# Patient Record
Sex: Female | Born: 1975
Health system: Southern US, Community
[De-identification: ages and names within clinical notes are randomized; demographics above are authoritative.]

## PROBLEM LIST (undated history)

## (undated) DIAGNOSIS — B019 Varicella without complication: Secondary | ICD-10-CM

## (undated) DIAGNOSIS — M1990 Unspecified osteoarthritis, unspecified site: Secondary | ICD-10-CM

## (undated) DIAGNOSIS — T7840XA Allergy, unspecified, initial encounter: Secondary | ICD-10-CM

## (undated) DIAGNOSIS — E7800 Pure hypercholesterolemia, unspecified: Secondary | ICD-10-CM

## (undated) DIAGNOSIS — M549 Dorsalgia, unspecified: Secondary | ICD-10-CM

## (undated) HISTORY — DX: Dorsalgia, unspecified: M54.9

## (undated) HISTORY — DX: Pure hypercholesterolemia, unspecified: E78.00

## (undated) HISTORY — DX: Varicella without complication: B01.9

## (undated) HISTORY — DX: Unspecified osteoarthritis, unspecified site: M19.90

## (undated) HISTORY — DX: Allergy, unspecified, initial encounter: T78.40XA

---

## 1999-08-17 ENCOUNTER — Inpatient Hospital Stay (HOSPITAL_COMMUNITY): Admission: AD | Admit: 1999-08-17 | Discharge: 1999-08-17 | Payer: Self-pay | Admitting: Obstetrics & Gynecology

## 2001-07-26 ENCOUNTER — Encounter: Payer: Self-pay | Admitting: Internal Medicine

## 2001-07-26 ENCOUNTER — Encounter: Admission: RE | Admit: 2001-07-26 | Discharge: 2001-07-26 | Payer: Self-pay | Admitting: Internal Medicine

## 2005-05-24 ENCOUNTER — Other Ambulatory Visit: Admission: RE | Admit: 2005-05-24 | Discharge: 2005-05-24 | Payer: Self-pay | Admitting: Obstetrics and Gynecology

## 2007-04-10 ENCOUNTER — Ambulatory Visit (HOSPITAL_COMMUNITY): Admission: RE | Admit: 2007-04-10 | Discharge: 2007-04-10 | Payer: Self-pay | Admitting: Gynecology

## 2007-07-27 ENCOUNTER — Ambulatory Visit (HOSPITAL_COMMUNITY): Admission: RE | Admit: 2007-07-27 | Discharge: 2007-07-27 | Payer: Self-pay | Admitting: Gynecology

## 2007-07-30 ENCOUNTER — Ambulatory Visit (HOSPITAL_COMMUNITY): Admission: RE | Admit: 2007-07-30 | Discharge: 2007-07-30 | Payer: Self-pay | Admitting: Gynecology

## 2008-02-06 ENCOUNTER — Ambulatory Visit: Payer: Self-pay | Admitting: Gynecology

## 2008-02-15 ENCOUNTER — Ambulatory Visit: Payer: Self-pay | Admitting: Gynecology

## 2008-03-06 ENCOUNTER — Ambulatory Visit: Payer: Self-pay | Admitting: Gynecology

## 2008-03-13 ENCOUNTER — Ambulatory Visit: Payer: Self-pay | Admitting: Gynecology

## 2008-03-17 ENCOUNTER — Ambulatory Visit: Payer: Self-pay | Admitting: Gynecology

## 2011-02-08 LAB — CBC
HCT: 36
Hemoglobin: 12.6
MCHC: 34.9
RDW: 12.7

## 2011-02-08 LAB — COMPREHENSIVE METABOLIC PANEL
Alkaline Phosphatase: 40
BUN: 4 — ABNORMAL LOW
Calcium: 9
Glucose, Bld: 90
Total Protein: 7.1

## 2012-01-18 ENCOUNTER — Ambulatory Visit (INDEPENDENT_AMBULATORY_CARE_PROVIDER_SITE_OTHER): Payer: BC Managed Care – PPO | Admitting: Gynecology

## 2012-01-18 ENCOUNTER — Encounter: Payer: Self-pay | Admitting: Gynecology

## 2012-01-18 VITALS — BP 110/70

## 2012-01-18 DIAGNOSIS — N949 Unspecified condition associated with female genital organs and menstrual cycle: Secondary | ICD-10-CM

## 2012-01-18 DIAGNOSIS — N925 Other specified irregular menstruation: Secondary | ICD-10-CM

## 2012-01-18 DIAGNOSIS — N912 Amenorrhea, unspecified: Secondary | ICD-10-CM

## 2012-01-18 DIAGNOSIS — N938 Other specified abnormal uterine and vaginal bleeding: Secondary | ICD-10-CM

## 2012-01-18 LAB — CBC WITH DIFFERENTIAL/PLATELET
Basophils Absolute: 0 10*3/uL (ref 0.0–0.1)
Eosinophils Absolute: 0 10*3/uL (ref 0.0–0.7)
Lymphs Abs: 3 10*3/uL (ref 0.7–4.0)
MCH: 30.9 pg (ref 26.0–34.0)
Neutrophils Relative %: 53 % (ref 43–77)
Platelets: 309 10*3/uL (ref 150–400)
RBC: 4.08 MIL/uL (ref 3.87–5.11)
WBC: 7.6 10*3/uL (ref 4.0–10.5)

## 2012-01-18 LAB — PREGNANCY, URINE: Preg Test, Ur: NEGATIVE

## 2012-01-18 MED ORDER — MEGESTROL ACETATE 40 MG PO TABS: 40.0000 mg | ORAL_TABLET | Freq: Two times a day (BID) | ORAL | Status: DC

## 2012-01-18 NOTE — Progress Notes (Signed)
Patient is a 36 year old gravida 0 who has not been seen the office since 2009. Patient stated that her last menstrual period was reported to be sometime in 2012. She denies any vasomotor symptoms, galactorrhea, headaches, or visual disturbances. She stated that in the early part of August she had a seven-day menses. She stated that she stopped for a few days and has been having a bloody discharge on and off since then until the present time. She denies any cramping. She has not been using any form of contraception. She has had history in the past of oligomenorrhea/primary infertility for which she stated that she had been on ovulation induction the past. We do not have the record available to evaluate the we will seek to obtain to review.  Urine pregnancy test negative  Exam: Abdomen: Soft nontender rebound or guarding Bartholin urethra Skene glands: Within normal limits Vagina: Blood present in the vaginal vault Cervix: No active bleeding noted no lesion seen Uterus: Slightly anteverted normal size shape and consistency Adnexa: No palpable masses or tenderness Rectal: Not examined  Patient was counseled for an endometrial biopsy due to the fact that she had been amenorrheic for one year and then began bleeding. The cervix was cleansed with Betadine solution and a Pipelle was introduced into the intrauterine cavity with a measurement of 7 cm. Minimal to moderate amount of tissue was obtained and was submitted for histological evaluation.  The following labs were drawn today: FSH, TSH, prolactin, and CBC  Assessment/plan: Patient with long-standing history of oligomenorrhea. Will await the results of the endometrial biopsy as well as the blood tests outlined above. She'll be given a prescription Megace 40 mg to take 1 by mouth twice a day for 10 days. She will return back next week for sonohysterogram to complete evaluation discuss the above tests.

## 2012-01-18 NOTE — Patient Instructions (Addendum)

## 2012-01-19 LAB — FOLLICLE STIMULATING HORMONE: FSH: 6.6 m[IU]/mL

## 2012-01-19 LAB — TSH: TSH: 0.823 u[IU]/mL (ref 0.350–4.500)

## 2012-01-24 ENCOUNTER — Ambulatory Visit (INDEPENDENT_AMBULATORY_CARE_PROVIDER_SITE_OTHER): Payer: BC Managed Care – PPO | Admitting: Gynecology

## 2012-01-24 ENCOUNTER — Ambulatory Visit (INDEPENDENT_AMBULATORY_CARE_PROVIDER_SITE_OTHER): Payer: BC Managed Care – PPO

## 2012-01-24 ENCOUNTER — Other Ambulatory Visit: Payer: Self-pay | Admitting: Gynecology

## 2012-01-24 DIAGNOSIS — N915 Oligomenorrhea, unspecified: Secondary | ICD-10-CM | POA: Insufficient documentation

## 2012-01-24 DIAGNOSIS — N8300 Follicular cyst of ovary, unspecified side: Secondary | ICD-10-CM

## 2012-01-24 DIAGNOSIS — N760 Acute vaginitis: Secondary | ICD-10-CM

## 2012-01-24 DIAGNOSIS — A499 Bacterial infection, unspecified: Secondary | ICD-10-CM

## 2012-01-24 DIAGNOSIS — N949 Unspecified condition associated with female genital organs and menstrual cycle: Secondary | ICD-10-CM

## 2012-01-24 DIAGNOSIS — N938 Other specified abnormal uterine and vaginal bleeding: Secondary | ICD-10-CM

## 2012-01-24 DIAGNOSIS — N898 Other specified noninflammatory disorders of vagina: Secondary | ICD-10-CM

## 2012-01-24 LAB — WET PREP FOR TRICH, YEAST, CLUE

## 2012-01-24 MED ORDER — METRONIDAZOLE 0.75 % VA GEL: 1.0000 | Freq: Two times a day (BID) | VAGINAL | Status: DC

## 2012-01-24 MED ORDER — METRONIDAZOLE 0.75 % VA GEL: VAGINAL | Status: DC

## 2012-01-24 NOTE — Progress Notes (Signed)
Patient is a 36 year old gravida 0 who was seen in the office on September 3 for the first time since 2009. She stated she has not had a menstrual periods in 2012 and denied any vasomotor symptoms, galactorrhea, headaches, or visual disturbances. She did state nearly part of August she had a seven-day menses. She stated she stopped for a few days and began having a bloody discharge on and off until the office visit of September 3. She does have a history in the past of oligomenorrhea/primary infertility and had been ovulation induction medication the past. On the office visit of September 3 her urine precipitous was negative. An endometrial biopsy was done with the following result:  FINAL DIAGNOSIS Diagnosis Endometrium, biopsy - DEGENERATING SECRETORY-TYPE ENDOMETRIUM. - THERE IS NO EVIDENCE OF HYPERPLASIA OR MALIGNANCY. - SEE COMMENT. Microscopic Comment This pattern can be associated with menses, irregular shedding or breakthrough bleeding associated with hormonal therapy. Clinical correlation is suggested. (JBK:eps 01/19/12)  Her recent labs demonstrated a normal CBC, FSH, or laughing, and TSH.  She presented to the office today for sonohysterogram for better assessment of her intrauterine cavity but has voiced concern that she had a fishy like odor discharge. The wet prep did demonstrate evidence of bacterial vaginosis so the sonohysterogram will be rescheduled for next week. She will be placed on MetroGel to apply each bedtime for 5 nights. She will finish off her Megace that was given to her 40 mg twice a day to stop her bleeding as well. The ultrasound today demonstrated the following:  Uterus measured 8.1 x 4.9 x 3.9 cm with endometrial stripe of 6.1 mm. Right left or otherwise normal. Endometrial stripe of 6.1 mm. No apparent masses noted on the right or left adnexa.

## 2012-01-24 NOTE — Patient Instructions (Addendum)
Bacterial Vaginosis Bacterial vaginosis (BV) is a vaginal infection where the normal balance of bacteria in the vagina is disrupted. The normal balance is then replaced by an overgrowth of certain bacteria. There are several different kinds of bacteria that can cause BV. BV is the most common vaginal infection in women of childbearing age. CAUSES   The cause of BV is not fully understood. BV develops when there is an increase or imbalance of harmful bacteria.   Some activities or behaviors can upset the normal balance of bacteria in the vagina and put women at increased risk including:   Having a new sex partner or multiple sex partners.   Douching.   Using an intrauterine device (IUD) for contraception.   It is not clear what role sexual activity plays in the development of BV. However, women that have never had sexual intercourse are rarely infected with BV.  Women do not get BV from toilet seats, bedding, swimming pools or from touching objects around them.  SYMPTOMS   Grey vaginal discharge.   A fish-like odor with discharge, especially after sexual intercourse.   Itching or burning of the vagina and vulva.   Burning or pain with urination.   Some women have no signs or symptoms at all.  DIAGNOSIS  Your caregiver must examine the vagina for signs of BV. Your caregiver will perform lab tests and look at the sample of vaginal fluid through a microscope. They will look for bacteria and abnormal cells (clue cells), a pH test higher than 4.5, and a positive amine test all associated with BV.  RISKS AND COMPLICATIONS   Pelvic inflammatory disease (PID).   Infections following gynecology surgery.   Developing HIV.   Developing herpes virus.  TREATMENT  Sometimes BV will clear up without treatment. However, all women with symptoms of BV should be treated to avoid complications, especially if gynecology surgery is planned. Female partners generally do not need to be treated. However,  BV may spread between female sex partners so treatment is helpful in preventing a recurrence of BV.   BV may be treated with antibiotics. The antibiotics come in either pill or vaginal cream forms. Either can be used with nonpregnant or pregnant women, but the recommended dosages differ. These antibiotics are not harmful to the baby.   BV can recur after treatment. If this happens, a second round of antibiotics will often be prescribed.   Treatment is important for pregnant women. If not treated, BV can cause a premature delivery, especially for a pregnant woman who had a premature birth in the past. All pregnant women who have symptoms of BV should be checked and treated.   For chronic reoccurrence of BV, treatment with a type of prescribed gel vaginally twice a week is helpful.  HOME CARE INSTRUCTIONS   Finish all medication as directed by your caregiver.   Do not have sex until treatment is completed.   Tell your sexual partner that you have a vaginal infection. They should see their caregiver and be treated if they have problems, such as a mild rash or itching.   Practice safe sex. Use condoms. Only have 1 sex partner.  PREVENTION  Basic prevention steps can help reduce the risk of upsetting the natural balance of bacteria in the vagina and developing BV:  Do not have sexual intercourse (be abstinent).   Do not douche.   Use all of the medicine prescribed for treatment of BV, even if the signs and symptoms go away.     Tell your sex partner if you have BV. That way, they can be treated, if needed, to prevent reoccurrence.  SEEK MEDICAL CARE IF:   Your symptoms are not improving after 3 days of treatment.   You have increased discharge, pain, or fever.  MAKE SURE YOU:   Understand these instructions.   Will watch your condition.   Will get help right away if you are not doing well or get worse.  FOR MORE INFORMATION  Division of STD Prevention (DSTDP), Centers for Disease  Control and Prevention: www.cdc.gov/std [employer] Association (ASHA): www.ashastd.org  Document Released: 05/03/2005 Document Revised: 04/22/2011 Document Reviewed: 10/24/2008 [employer] Patient Information 2012 [employer]. 

## 2012-02-02 ENCOUNTER — Encounter: Payer: Self-pay | Admitting: Gynecology

## 2012-02-02 ENCOUNTER — Ambulatory Visit (INDEPENDENT_AMBULATORY_CARE_PROVIDER_SITE_OTHER): Payer: BC Managed Care – PPO | Admitting: Gynecology

## 2012-02-02 ENCOUNTER — Ambulatory Visit (INDEPENDENT_AMBULATORY_CARE_PROVIDER_SITE_OTHER): Payer: BC Managed Care – PPO

## 2012-02-02 DIAGNOSIS — N921 Excessive and frequent menstruation with irregular cycle: Secondary | ICD-10-CM

## 2012-02-02 DIAGNOSIS — N949 Unspecified condition associated with female genital organs and menstrual cycle: Secondary | ICD-10-CM

## 2012-02-02 DIAGNOSIS — N760 Acute vaginitis: Secondary | ICD-10-CM

## 2012-02-02 DIAGNOSIS — N915 Oligomenorrhea, unspecified: Secondary | ICD-10-CM

## 2012-02-02 DIAGNOSIS — N938 Other specified abnormal uterine and vaginal bleeding: Secondary | ICD-10-CM

## 2012-02-02 DIAGNOSIS — N970 Female infertility associated with anovulation: Secondary | ICD-10-CM

## 2012-02-02 MED ORDER — NYSTATIN-TRIAMCINOLONE 100000-0.1 UNIT/GM-% EX CREA: TOPICAL_CREAM | Freq: Three times a day (TID) | CUTANEOUS | Status: DC

## 2012-02-02 MED ORDER — MEDROXYPROGESTERONE ACETATE 10 MG PO TABS: ORAL_TABLET | ORAL | Status: DC

## 2012-02-02 NOTE — Patient Instructions (Addendum)
Take provera one tablet every 30 days if no period and after you ndo a home pregnancy test and it is negative. If you have a normal period every 30 days then you do not need medication. Call Dr. April Manson Reproductive Endocrinologist for appointment here in [place]. I will send him your medical information. Keep taking one prenatal vitamin daily.

## 2012-02-02 NOTE — Progress Notes (Signed)
Patient is a 36 year old gravida 0 who was seen in the office on September 3 for the first time since 2009. She stated she has not had a menstrual periods in 2012 and denied any vasomotor symptoms, galactorrhea, headaches, or visual disturbances. She did state that and the early part of August she had a seven-day menses. She stated she stopped for a few days and began having a bloody discharge on and off until the office visit of September 3. She does have a history in the past of oligomenorrhea/primary infertility and had received ovulation induction medication the past. She had seen Dr. Morrison Old in the past at [facility]. She went through 1 cycle superovulation there and did not conceive. I had given her several trials of clomiphene citrate along with intrauterine semination but was not able to conceive and she eventually went through 4 cycles of Pergonal all along with intrauterine semination and did not conceive. She did conceive with her second Pergonal  injection where she was receiving 2 amps  daily  but became hyperstimulated and spontaneously aborted.   On the office visit of September 3 her urine pregnancy test was negative. An endometrial biopsy was done with the following result:  FINAL DIAGNOSIS  Diagnosis  Endometrium, biopsy  - DEGENERATING SECRETORY-TYPE ENDOMETRIUM.  - THERE IS NO EVIDENCE OF HYPERPLASIA OR MALIGNANCY.  - SEE COMMENT.  Microscopic Comment  This pattern can be associated with menses, irregular shedding or breakthrough bleeding associated with  hormonal therapy. Clinical correlation is suggested. (JBK:eps 01/19/12)  Her recent labs demonstrated a normal CBC, FSH, or laughing, and TSH.   She presented to the office on September 9 for a sonohysterogram for better assessment of her intrauterine cavity but had voiced concern that she had a fishy like odor discharge. The wet prep did demonstrate evidence of bacterial vaginosis so the  sonohysterogram was rescheduled for today. She was placed on MetroGel to apply each bedtime for 5 nights. She finished off her Megace that was given to her 40 mg twice a day to stop her bleeding as well. The ultrasound  demonstrated the following:   Uterus measured 8.1 x 4.9 x 3.9 cm with endometrial stripe of 6.1 mm. Right left or otherwise normal. Endometrial stripe of 6.1 mm. No apparent masses noted on the right or left adnexa.  Sonohysterogram today demonstrated no intracavitary defect today.    Assessment/plan: Patient with chronic anovulation contributing to her infertility. Had responded to superovulation with Pergonal but on 2 amps had conceived but aborted. Her husband has viscous semen but normal count. She has had normal tubal patency on HSG. Patient's recent irregular bleeding as a result of her long-standing anovulation. She was given a prescription of Provera 10 mg to take for 10 days each month if she does not have a spontaneous menses but she should do a home pregnancy test first. She was having some mild external vulvovaginitis and she was given prescription mytrex cream to apply twice a day when necessary. I'm going to refer her to the reproductive endocrinologist Dr. Fermin Schwab to assist with her chronic anovulation/infertility for possible consideration of in vitro fertilization. We'll for him a copy of this office note and she will call his office for an appointment.

## 2012-07-05 ENCOUNTER — Encounter: Payer: Self-pay | Admitting: Family Medicine

## 2012-07-06 ENCOUNTER — Encounter: Payer: Self-pay | Admitting: Gynecology

## 2012-07-06 ENCOUNTER — Ambulatory Visit (INDEPENDENT_AMBULATORY_CARE_PROVIDER_SITE_OTHER): Payer: BC Managed Care – PPO | Admitting: Gynecology

## 2012-07-06 DIAGNOSIS — N979 Female infertility, unspecified: Secondary | ICD-10-CM

## 2012-07-06 DIAGNOSIS — M549 Dorsalgia, unspecified: Secondary | ICD-10-CM

## 2012-07-06 DIAGNOSIS — L293 Anogenital pruritus, unspecified: Secondary | ICD-10-CM

## 2012-07-06 DIAGNOSIS — N898 Other specified noninflammatory disorders of vagina: Secondary | ICD-10-CM

## 2012-07-06 DIAGNOSIS — N760 Acute vaginitis: Secondary | ICD-10-CM

## 2012-07-06 DIAGNOSIS — N915 Oligomenorrhea, unspecified: Secondary | ICD-10-CM

## 2012-07-06 LAB — WET PREP FOR TRICH, YEAST, CLUE: WBC, Wet Prep HPF POC: NONE SEEN

## 2012-07-06 MED ORDER — CLINDAMYCIN PHOSPHATE 2 % VA CREA: 1.0000 | TOPICAL_CREAM | Freq: Every day | VAGINAL | Status: DC

## 2012-07-06 MED ORDER — MEDROXYPROGESTERONE ACETATE 10 MG PO TABS: ORAL_TABLET | ORAL | Status: DC

## 2012-07-06 NOTE — Patient Instructions (Addendum)
Endometriosis Endometriosis is a disease that occurs when the endometrium (lining of the uterus) is misplaced outside of its normal location. It may occur in many locations close to the uterus (womb), but commonly on the ovaries, fallopian tubes, vagina (birth canal) and bowel located close to the uterus. Because the uterus sloughs (expels) its lining every month (menses), there is bleeding whereever the endometrial tissue is located. SYMPTOMS  Often there are no symptoms. However, because blood is irritating to tissues not normally exposed to it, when symptoms occur they vary with the location of the misplaced endometrium. Symptoms often include back and abdominal pain. Periods may be heavier and intercourse may be painful. Infertility may be present. You may have all of these symptoms at one time or another or you may have months with no symptoms at all. Although the symptoms occur mainly during menses, they can occur mid-cycle as well, and usually terminate with menopause. DIAGNOSIS  Your caregiver may recommend a blood test and urine test (urinalysis) to help rule out other conditions. Another common test is ultrasound, a painless procedure that uses sound waves to make a sonogram "picture" of abnormal tissue that could be endometriosis. If your bowel movements are painful around your periods, your caregiver may advise a barium enema (an X-ray of the lower bowel), to try to find the source of your pain. This is sometimes confirmed by laparoscopy. Laparoscopy is a procedure where your caregiver looks into your abdomen with a laparoscope (a small pencil sized telescope). Your caregiver may take a tiny piece of tissue (biopsy) from any abnormal tissue to confirm or document your problem. These tissues are sent to the lab and a pathologist looks at them under the microscope to give a microscopic diagnosis. TREATMENT  Once the diagnosis is made, it can be treated by destruction of the misplaced endometrial  tissue using heat (diathermy), laser, cutting (excision), or chemical means. It may also be treated with hormonal therapy. When using hormonal therapy menses are eliminated, therefore eliminating the monthly exposure to blood by the misplaced endometrial tissue. Only in severe cases is it necessary to perform a hysterectomy with removal of the tubes, uterus and ovaries. HOME CARE INSTRUCTIONS   Only take over-the-counter or prescription medicines for pain, discomfort, or fever as directed by your caregiver.  Avoid activities that produce pain, including a physical sexual relationship.  Do not take aspirin as this may increase bleeding when not on hormonal therapy.  See your caregiver for pain or problems not controlled with treatment. SEEK IMMEDIATE MEDICAL CARE IF:   Your pain is severe and is not responding to pain medicine.  You develop severe nausea and vomiting, or you cannot keep foods down.  Your pain localizes to the right lower part of your abdomen (possible appendicitis).  You have swelling or increasing pain in the abdomen.  You have a fever.  You see blood in your stool. MAKE SURE YOU:   Understand these instructions.  Will watch your condition.  Will get help right away if you are not doing well or get worse. Document Released: 04/30/2000 Document Revised: 07/26/2011 Document Reviewed: 12/20/2007 [facility] Patient Information 2013 [place], [place].  Diagnostic Laparoscopy Laparoscopy is a surgical procedure. It is used to diagnose and treat diseases inside the belly(abdomen). It is usually a brief, common, and relatively simple procedure. The laparoscopeis a thin, lighted, pencil-sized instrument. It is like a telescope. It is inserted into your abdomen through a small cut (incision). Your caregiver can look at the  organs inside your body through this instrument. He or she can see if there is anything abnormal. Laparoscopy can be done either in a hospital or outpatient  clinic. You may be given a mild sedative to help you relax before the procedure. Once in the operating room, you will be given a drug to make you sleep (general anesthesia). Laparoscopy usually lasts less than 1 hour. After the procedure, you will be monitored in a recovery area until you are stable and doing well. Once you are home, it will take 2 to 3 days to fully recover. RISKS AND COMPLICATIONS  Laparoscopy has relatively few risks. Your caregiver will discuss the risks with you before the procedure. Some problems that can occur include:  Infection.  Bleeding.  Damage to other organs.  Anesthetic side effects. PROCEDURE Once you receive anesthesia, your surgeon inflates the abdomen with a harmless gas (carbon dioxide). This makes the organs easier to see. The laparoscope is inserted into the abdomen through a small incision. This allows your surgeon to see into the abdomen. Other small instruments are also inserted into the abdomen through other small openings. Many surgeons attach a video camera to the laparoscope to enlarge the view. During a diagnostic laparoscopy, the surgeon may be looking for inflammation, infection, or cancer. Your surgeon may take tissue samples(biopsies). The samples are sent to a specialist in looking at cells and tissue samples (pathologist). The pathologist examines them under a microscope. Biopsies can help to diagnose or confirm a disease. AFTER THE PROCEDURE   The gas is released from inside the abdomen.  The incisions are closed with stitches (sutures). Because these incisions are small (usually less than 1/2 inch), there is usually minimal discomfort after the procedure. There may be some mild discomfort in the throat. This is from the tube placed in the throat while you were sleeping. You may have some mild abdominal discomfort. There may also be discomfort from the instrument placement incisions in the abdomen.  The recovery time is shortened as long as  there are no complications.  You will rest in a recovery room until stable and doing well. As long as there are no complications, you may be allowed to go home. FINDING OUT THE RESULTS OF YOUR TEST Not all test results are available during your visit. If your test results are not back during the visit, make an appointment with your caregiver to find out the results. Do not assume everything is normal if you have not heard from your caregiver or the medical facility. It is important for you to follow up on all of your test results. HOME CARE INSTRUCTIONS   Take all medicines as directed.  Only take over-the-counter or prescription medicines for pain, discomfort, or fever as directed by your caregiver.  Resume daily activities as directed.  Showers are preferred over baths.  You may resume sexual activities in 1 week or as directed.  Do not drive while taking narcotics. SEEK MEDICAL CARE IF:   There is increasing abdominal pain.  There is new pain in the shoulders (shoulder strap areas).  You feel lightheaded or faint.  You have the chills.  You or your child has an oral temperature above 102 F (38.9 C).  There is pus-like (purulent) drainage from any of the wounds.  You are unable to pass gas or have a bowel movement.  You feel sick to your stomach (nauseous) or throw up (vomit). MAKE SURE YOU:   Understand these instructions.  Will watch  your condition.  Will get help right away if you are not doing well or get worse. Document Released: 08/09/2000 Document Revised: 07/26/2011 Document Reviewed: 05/03/2007 [facility] Patient Information 2013 [place], [place].  Infertility WHAT IS INFERTILITY?  Infertility is usually defined as not being able to get pregnant after trying for one year of regular sexual intercourse without the use of contraceptives. Or not being able to carry a pregnancy to term and have a baby. The infertility rate in the [place] is around  10%. Pregnancy is the result of a chain of events. A woman must release an egg from one of her ovaries (ovulation). The egg must be fertilized by the female sperm. Then it travels through a fallopian tube into the uterus (womb), where it attaches to the wall of the uterus and grows. A man must have enough sperm, and the sperm must join with (fertilize) the egg along the way, at the proper time. The fertilized egg must then become attached to the inside of the uterus. While this may seem simple, many things can happen to prevent pregnancy from occurring.  WHOSE PROBLEM IS IT?  About 20% of infertility cases are due to problems with the man (female factors) and 65% are due to problems with the woman (female factors). Other cases are due to a combination of female and female factors or to unknown causes.  WHAT CAUSES INFERTILITY IN MEN?  Infertility in men is often caused by problems with making enough normal sperm or getting the sperm to reach the egg. Problems with sperm may exist from birth or develop later in life, due to illness or injury. Some men produce no sperm, or produce too few sperm (oligospermia). Other problems include:  Sexual dysfunction.  Hormonal or endocrine problems.  Age. Female fertility decreases with age, but not at as young an age as female fertility.  Infection.  Congenital problems. Birth defect, such as absence of the tubes that carry the sperm (vas deferens).  Genetic/chromosomal problems.  Antisperm antibody problems.  Retrograde ejaculation (sperm go into the bladder).  Varicoceles, spematoceles, or tumors of the testicles.  Lifestyle can influence the number and quality of a man's sperm.  Alcohol and drugs can temporarily reduce sperm quality.  Environmental toxins, including pesticides and lead, may cause some cases of infertility in men. WHAT CAUSES INFERTILITY IN WOMEN?   Problems with ovulation account for most infertility in women. Without ovulation, eggs  are not available to be fertilized.  Signs of problems with ovulation include irregular menstrual periods or no periods at all.  Simple lifestyle factors, including stress, diet, or athletic training, can affect a woman's hormonal balance.  Age. Fertility begins to decrease in women in the early 31s and is worse after age 65.  Much less often, a hormonal imbalance from a serious medical problem, such as a pituitary gland tumor, thyroid or other chronic medical disease, can cause ovulation problems.  Pelvic infections.  Polycystic ovary syndrome (increase in female hormones, unable to ovulate).  Alcohol or illegal drugs.  Environmental toxins, radiation, pesticides, and certain chemicals.  Aging is an important factor in female infertility.  The ability of a woman's ovaries to produce eggs declines with age, especially after age 36. About one third of couples where the woman is over 35 will have problems with fertility.  By the time she reaches menopause when her monthly periods stop for good, a woman can no longer produce eggs or become pregnant.  Other problems can also lead  to infertility in women. If the fallopian tubes are blocked at one or both ends, the egg cannot travel through the tubes into the uterus. Scar tissue (adhesions) in the pelvis may cause blocked tubes. This may result from pelvic inflammatory disease, endometriosis, or surgery for an ectopic pregnancy (fertilized egg implanted outside the uterus) or any pelvic or abdominal surgery causing adhesions.  Fibroid tumors or polyps of the uterus.  Congenital (birth defect) abnormalities of the uterus.  Infection of the cervix (cervicitis).  Cervical stenosis (narrowing).  Abnormal cervical mucus.  Polycystic ovary syndrome.  Having sexual intercourse too often (every other day or 4 to 5 times a week).  Obesity.  Anorexia.  Poor nutrition.  Over exercising, with loss of body fat.  DES. Your mother received  diethylstilbesterol hormone when pregnant with you. HOW IS INFERTILITY TESTED?  If you have been trying to have a baby without success, you may want to seek medical help. You should not wait for one year of trying before seeing a health care provider if:  You are over 35.  You have reason to believe that there may be a fertility problem. A medical evaluation may determine the reasons for a couple's infertility. Usually this process begins with:  Physical exams.  Medical histories of both partners.  Sexual histories of both partners. If there is no obvious problem, like improperly timed intercourse or absence of ovulation, tests may be needed.   For a man, testing usually begins with tests of his semen to look at:  The number of sperm.  The shape of sperm.  Movement of his sperm.  Taking a complete medical and surgical history.  Physical examination.  Check for infection of the female reproductive organs. Sometimes hormone tests are done.   For a woman, the first step in testing is to find out if she is ovulating each month. There are several ways to do this. For example, she can keep track of changes in her morning body temperature and in the texture of her cervical mucus. Another tool is a home ovulation test kit, which can be bought at drug or grocery stores.  Checks of ovulation can also be done in the doctor's office, using blood tests for hormone levels or ultrasound tests of the ovaries. If the woman is ovulating, more tests will need to be done. Some common female tests include:  Hysterosalpingogram: An x-ray of the fallopian tubes and uterus after they are injected with dye. It shows if the tubes are open and shows the shape of the uterus.  Laparoscopy: An exam of the tubes and other female organs for disease. A lighted tube called a laparoscope is used to see inside the abdomen.  Endometrial biopsy: Sample of uterus tissue taken on the first day of the menstrual period,  to see if the tissue indicates you are ovulating.  Transvaginal ultrasound: Examines the female organs.  Hysteroscopy: Uses a lighted tube to examine the cervix and inside the uterus, to see if there are any abnormalities inside the uterus. TREATMENT  Depending on the test results, different treatments can be suggested. The type of treatment depends on the cause. 85 to 90% of infertility cases are treated with drugs or surgery.   Various fertility drugs may be used for women with ovulation problems. It is important to talk with your caregiver about the drug to be used. You should understand the drug's benefits and side effects. Depending on the type of fertility drug and the dosage of  the drug used, multiple births (twins or multiples) can occur in some women.  If needed, surgery can be done to repair damage to a woman's ovaries, fallopian tubes, cervix, or uterus.  Surgery or medical treatment for endometriosis or polycystic ovary syndrome. Sometimes a man has an infertility problem that can be corrected with medicine or by surgery.  Intrauterine insemination (IUI) of sperm, timed with ovulation.  Change in lifestyle, if that is the cause (lose weight, increase exercise, and stop smoking, drinking excessively, or taking illegal drugs).  Other types of surgery:  Removing growths inside and on the uterus.  Removing scar tissue from inside of the uterus.  Fixing blocked tubes.  Removing scar tissue in the pelvis and around the female organs. WHAT IS ASSISTED REPRODUCTIVE TECHNOLOGY (ART)?  Assisted reproductive technology (ART) is another form of special methods used to help infertile couples. ART involves handling both the woman's eggs and the man's sperm. Success rates vary and depend on many factors. ART can be expensive and time-consuming. But ART has made it possible for many couples to have children that otherwise would not have been conceived. Some methods are listed below:  In  vitro fertilization (IVF). IVF is often used when a woman's fallopian tubes are blocked or when a man has low sperm counts. A drug is used to stimulate the ovaries to produce multiple eggs. Once mature, the eggs are removed and placed in a culture dish with the man's sperm for fertilization. After about 40 hours, the eggs are examined to see if they have become fertilized by the sperm and are dividing into cells. These fertilized eggs (embryos) are then placed in the woman's uterus. This bypasses the fallopian tubes.  Gamete intrafallopian transfer (GIFT) is similar to IVF, but used when the woman has at least one normal fallopian tube. Three to five eggs are placed in the fallopian tube, along with the man's sperm, for fertilization inside the woman's body.  Zygote intrafallopian transfer (ZIFT), also called tubal embryo transfer, combines IVF and GIFT. The eggs retrieved from the woman's ovaries are fertilized in the lab and placed in the fallopian tubes rather than in the uterus.  ART procedures sometimes involve the use of donor eggs (eggs from another woman) or previously frozen embryos. Donor eggs may be used if a woman has impaired ovaries or has a genetic disease that could be passed on to her baby.  When performing ART, you are at higher risk for resulting in multiple pregnancies, twins, triplets or more.  Intracytoplasma sperm injection is a procedure that injects a single sperm into the egg to fertilize it.  Embryo transplant is a procedure that starts after growing an embryo in a special media (chemical solution) developed to keep the embryo alive for 2 to 5 days, and then transplanting it into the uterus. In cases where a cause cannot be found and pregnancy does not occur, adoption may be a consideration. Document Released: 05/06/2003 Document Revised: 07/26/2011 Document Reviewed: 04/01/2009 [facility] Patient Information 2013 [place], [place].   Every 30 days if you do not have a  spontaneous period do a home pregnancy test. If it is negative take the provera 10mg  one daily for 5- 10 days to start your period. If you have a period every 30 days by itself then you do not need to take the provera that month. Remember to take a prenatal vitamin daily.  You can use the ovulation predictor kit that you can purchase in the pharmacy. First  day of bleeding is first day of cycle . On day 12-16 check your urine with the ovulation predictor twcie a day. If it changes this means that you will be ovulating that night and that you should have sex with husband that night and next night.

## 2012-07-06 NOTE — Progress Notes (Signed)
Patient is a 37 year old with history of oligomenorrhea and primary infertility.she presented to the office today complaining of vulvar pruritus. There appears to be a slight [place] barrier. She has been evaluated several months ago and was found to have a normal HSG, FSH, TSH, and prolactin.husband had a normal semen analysis only slightly viscous. She had seen Dr. Morrison Old in the past at [facility] infertility clinic. She went through 1 cycle superovulation there and did not conceive. I had given her several trials of clomiphene citrate along with intrauterine semination but was not able to conceive and she eventually went through 4 cycles of Pergonal all along with intrauterine semination and did not conceive. She did conceive with her second Pergonal injection where she was receiving 2 amps daily but became hyperstimulated and spontaneously aborted.  Because of her oligomenorrhea on 01/18/2012 she had a benign endometrial biopsy.  Patient had previously been given instructions to take Provera 10 mg for 10 days of the month if she did not have a spontaneous menses every 30 days providing that she had done a home pregnancy test at home. She states that she has not been taking the medication and had not had a menstrual period since September but did have one for 5 days of every 10th of this year. She denied any visual disturbances or any unusual headaches. She had been suffering from back pain and saw an acupuncturist who is treating her for a suspected herniated disc. She denies any dyspareunia but was concerned whether she may have underlying endometriosis.  We went through a lengthy discussion on endometriosis pocket diagnosis and had that treated. I have provided her with literature information on endometriosis, as well as a laparoscopy. She is overdue for her annual exam and we'll schedule one for next month and we will discuss a day and. We had referred her to a different reproductive  endocrinologist for consideration of in vitro fertilization but she states it is too expensive.  Her pelvic exam: Bartholin urethra Skene was within normal limits Vagina: Clear fascia odor-like discharge Cervix: No lesions seen Uterus: Anteverted normal size shape and consistency Adnexa: No palpable masses or tenderness Rectal exam not done  Wet prep positive amine, moderate clue cells too numerous to count bacteria.  Assessment/plan: Bacterial vaginosis will be treated with Cleocin vaginal cream to apply each bedtime for 5 days. Written instructions on taking Provera 10 mg every 30 days if she does not have a spontaneous menses providing that she does a home urine pregnancy test first. I also gave her information on utilization and timing of the ovulation predictor kit to help her conceive. She was reminded to take a prenatal vitamin daily. She will return back next month for annual exam and we will discuss if she wants to proceed with a laparoscopy.

## 2012-07-31 ENCOUNTER — Encounter: Payer: BC Managed Care – PPO | Admitting: Gynecology

## 2012-07-31 ENCOUNTER — Other Ambulatory Visit: Payer: BC Managed Care – PPO

## 2012-09-12 ENCOUNTER — Telehealth: Payer: Self-pay | Admitting: General Practice

## 2012-09-12 ENCOUNTER — Ambulatory Visit (INDEPENDENT_AMBULATORY_CARE_PROVIDER_SITE_OTHER): Payer: BC Managed Care – PPO | Admitting: Family Medicine

## 2012-09-12 ENCOUNTER — Encounter: Payer: Self-pay | Admitting: Family Medicine

## 2012-09-12 VITALS — BP 90/70 | HR 74 | Temp 97.8°F | Ht 59.75 in | Wt 110.6 lb

## 2012-09-12 DIAGNOSIS — Z0000 Encounter for general adult medical examination without abnormal findings: Secondary | ICD-10-CM

## 2012-09-12 DIAGNOSIS — Z800 Family history of malignant neoplasm of digestive organs: Secondary | ICD-10-CM | POA: Insufficient documentation

## 2012-09-12 DIAGNOSIS — Z1331 Encounter for screening for depression: Secondary | ICD-10-CM

## 2012-09-12 DIAGNOSIS — N970 Female infertility associated with anovulation: Secondary | ICD-10-CM

## 2012-09-12 LAB — CBC WITH DIFFERENTIAL/PLATELET
Basophils Relative: 0.5 % (ref 0.0–3.0)
Eosinophils Relative: 1.3 % (ref 0.0–5.0)
Hemoglobin: 14 g/dL (ref 12.0–15.0)
Lymphocytes Relative: 35.5 % (ref 12.0–46.0)
MCHC: 35.2 g/dL (ref 30.0–36.0)
Monocytes Relative: 8.8 % (ref 3.0–12.0)
Neutro Abs: 4.2 10*3/uL (ref 1.4–7.7)
RBC: 4.4 Mil/uL (ref 3.87–5.11)
WBC: 7.7 10*3/uL (ref 4.5–10.5)

## 2012-09-12 LAB — LIPID PANEL
Cholesterol: 253 mg/dL — ABNORMAL HIGH (ref 0–200)
Total CHOL/HDL Ratio: 5

## 2012-09-12 LAB — HEPATIC FUNCTION PANEL
ALT: 43 U/L — ABNORMAL HIGH (ref 0–35)
AST: 29 U/L (ref 0–37)
Total Protein: 7.6 g/dL (ref 6.0–8.3)

## 2012-09-12 LAB — BASIC METABOLIC PANEL
BUN: 9 mg/dL (ref 6–23)
CO2: 29 mEq/L (ref 19–32)
Chloride: 105 mEq/L (ref 96–112)
Creatinine, Ser: 0.6 mg/dL (ref 0.4–1.2)
Glucose, Bld: 101 mg/dL — ABNORMAL HIGH (ref 70–99)
Potassium: 3.8 mEq/L (ref 3.5–5.1)

## 2012-09-12 LAB — LDL CHOLESTEROL, DIRECT: Direct LDL: 189.4 mg/dL

## 2012-09-12 LAB — TSH: TSH: 1.73 u[IU]/mL (ref 0.35–5.50)

## 2012-09-12 MED ORDER — IBUPROFEN 600 MG PO TABS: 600.0000 mg | ORAL_TABLET | Freq: Three times a day (TID) | ORAL | Status: DC | PRN

## 2012-09-12 NOTE — Telephone Encounter (Signed)
Pt called to the office returning Mary's call regarding referral to OBGYN. Please make sure to contact pt at [phone]. Thanks!

## 2012-09-12 NOTE — Assessment & Plan Note (Signed)
New to provider, ongoing for pt.  Pt prefers to to see female GYN.  Referral made.

## 2012-09-12 NOTE — Assessment & Plan Note (Signed)
Pt's PE WNL.  Check labs.  UTD on GYN.  Anticipatory guidance provided.

## 2012-09-12 NOTE — Progress Notes (Signed)
  Subjective:    Patient ID: Sophia Moody, female    DOB: May 12, 1976, 37 y.o.   MRN: [id number]  HPI New to establish.  GYN- Lily Peer  Vaginal odor- pt reports strong odor after intercourse.  Odor will last for 1-2 days.  Denies vaginal d/c, itching, or burning.  Has hx of BV.  No current odor.  Pt is not comfortable w/ female GYN.  Family hx of liver cancer- dad died of liver cancer 4 yrs ago, pt unaware if he had Hepatitis.  Pt immigrated from [place].  Review of Systems Patient reports no vision/ hearing changes, adenopathy,fever, weight change,  persistant/recurrent hoarseness , swallowing issues, chest pain, palpitations, edema, persistant/recurrent cough, hemoptysis, dyspnea (rest/exertional/paroxysmal nocturnal), gastrointestinal bleeding (melena, rectal bleeding), abdominal pain, significant heartburn, bowel changes, GU symptoms (dysuria, hematuria, incontinence),  syncope, focal weakness, memory loss, numbness & tingling, skin/hair/nail changes, abnormal bruising or bleeding, anxiety, or depression.     Objective:   Physical Exam General Appearance:    Alert, cooperative, no distress, appears stated age  Head:    Normocephalic, without obvious abnormality, atraumatic  Eyes:    PERRL, conjunctiva/corneas clear, EOM's intact, fundi    benign, both eyes  Ears:    Normal TM's and external ear canals, both ears  Nose:   Nares normal, septum midline, mucosa normal, no drainage    or sinus tenderness  Throat:   Lips, mucosa, and tongue normal; teeth and gums normal  Neck:   Supple, symmetrical, trachea midline, no adenopathy;    Thyroid: no enlargement/tenderness/nodules  Back:     Symmetric, no curvature, ROM normal, no CVA tenderness  Lungs:     Clear to auscultation bilaterally, respirations unlabored  Chest Wall:    No tenderness or deformity   Heart:    Regular rate and rhythm, S1 and S2 normal, no murmur, rub   or gallop  Breast Exam:    Deferred to GYN  Abdomen:     Soft,  non-tender, bowel sounds active all four quadrants,    no masses, no organomegaly  Genitalia:    Deferred to GYN  Rectal:    Extremities:   Extremities normal, atraumatic, no cyanosis or edema  Pulses:   2+ and symmetric all extremities  Skin:   Skin color, texture, turgor normal, no rashes or lesions  Lymph nodes:   Cervical, supraclavicular, and axillary nodes normal  Neurologic:   CNII-XII intact, normal strength, sensation and reflexes    throughout          Assessment & Plan:

## 2012-09-12 NOTE — Assessment & Plan Note (Signed)
New.  Pt is uncertain whether father had hepatitis contributing to liver cancer. Pt is at higher risk due to fact she is from [place].  Will check labs to determine if pt is infected.

## 2012-09-12 NOTE — Patient Instructions (Signed)
We'll notify you of your lab results Someone will call you with your GYN appt Take the Ibuprofen as needed for the back pain- take w/ food Call with any questions or concerns Welcome!  We're glad to have you!

## 2012-09-13 LAB — HEPATITIS C ANTIBODY: HCV Ab: NEGATIVE

## 2012-09-15 LAB — VITAMIN D 1,25 DIHYDROXY
Vitamin D2 1, 25 (OH)2: <8 pg/mL
Vitamin D3 1, 25 (OH)2: 36 pg/mL

## 2012-09-18 ENCOUNTER — Encounter: Payer: Self-pay | Admitting: General Practice

## 2013-04-19 ENCOUNTER — Ambulatory Visit: Payer: BC Managed Care – PPO | Admitting: Family Medicine

## 2013-04-19 DIAGNOSIS — Z0289 Encounter for other administrative examinations: Secondary | ICD-10-CM

## 2013-07-22 ENCOUNTER — Other Ambulatory Visit: Payer: Self-pay | Admitting: Gynecology

## 2014-03-18 ENCOUNTER — Encounter: Payer: Self-pay | Admitting: Family Medicine

## 2014-05-17 LAB — HM MAMMOGRAPHY

## 2014-05-17 LAB — HM PAP SMEAR

## 2015-07-23 ENCOUNTER — Encounter: Payer: Self-pay | Admitting: Behavioral Health

## 2015-07-23 ENCOUNTER — Telehealth: Payer: Self-pay | Admitting: Behavioral Health

## 2015-07-23 NOTE — Telephone Encounter (Signed)
Pre-Visit Call completed with patient and chart updated.   Pre-Visit Info documented in Specialty Comments under SnapShot.    

## 2015-07-24 ENCOUNTER — Encounter: Payer: Self-pay | Admitting: Family Medicine

## 2015-07-24 ENCOUNTER — Ambulatory Visit (INDEPENDENT_AMBULATORY_CARE_PROVIDER_SITE_OTHER): Payer: BLUE CROSS/BLUE SHIELD | Admitting: Family Medicine

## 2015-07-24 VITALS — BP 90/70 | HR 87 | Temp 97.6°F | Ht 62.0 in | Wt 104.8 lb

## 2015-07-24 DIAGNOSIS — Z13220 Encounter for screening for lipoid disorders: Secondary | ICD-10-CM

## 2015-07-24 DIAGNOSIS — N970 Female infertility associated with anovulation: Secondary | ICD-10-CM

## 2015-07-24 DIAGNOSIS — Z131 Encounter for screening for diabetes mellitus: Secondary | ICD-10-CM

## 2015-07-24 DIAGNOSIS — N912 Amenorrhea, unspecified: Secondary | ICD-10-CM

## 2015-07-24 DIAGNOSIS — Z130 Encounter for screening for diseases of the blood and blood-forming organs and certain disorders involving the immune mechanism: Secondary | ICD-10-CM

## 2015-07-24 DIAGNOSIS — Z7189 Other specified counseling: Secondary | ICD-10-CM

## 2015-07-24 DIAGNOSIS — M545 Low back pain, unspecified: Secondary | ICD-10-CM

## 2015-07-24 DIAGNOSIS — Z7689 Persons encountering health services in other specified circumstances: Secondary | ICD-10-CM

## 2015-07-24 LAB — CBC
HEMATOCRIT: 42.6 % (ref 36.0–46.0)
Hemoglobin: 14.2 g/dL (ref 12.0–15.0)
MCHC: 33.4 g/dL (ref 30.0–36.0)
MCV: 91.5 fl (ref 78.0–100.0)
Platelets: 255 10*3/uL (ref 150.0–400.0)
RBC: 4.66 Mil/uL (ref 3.87–5.11)
RDW: 13.1 % (ref 11.5–15.5)
WBC: 8.3 10*3/uL (ref 4.0–10.5)

## 2015-07-24 LAB — LIPID PANEL
CHOL/HDL RATIO: 4
CHOLESTEROL: 225 mg/dL — AB (ref 0–200)
HDL: 55.2 mg/dL (ref >39.00)
LDL CALC: 150 mg/dL — AB (ref 0–99)
NonHDL: 169.52
Triglycerides: 100 mg/dL (ref 0.0–149.0)
VLDL: 20 mg/dL (ref 0.0–40.0)

## 2015-07-24 LAB — COMPREHENSIVE METABOLIC PANEL
ALBUMIN: 4.5 g/dL (ref 3.5–5.2)
ALK PHOS: 52 U/L (ref 39–117)
ALT: 16 U/L (ref 0–35)
AST: 19 U/L (ref 0–37)
BILIRUBIN TOTAL: 0.3 mg/dL (ref 0.2–1.2)
BUN: 13 mg/dL (ref 6–23)
CO2: 27 mEq/L (ref 19–32)
CREATININE: 0.68 mg/dL (ref 0.40–1.20)
Calcium: 9.4 mg/dL (ref 8.4–10.5)
Chloride: 104 mEq/L (ref 96–112)
GFR: 101.45 mL/min (ref >60.00)
GLUCOSE: 99 mg/dL (ref 70–99)
Potassium: 4.6 mEq/L (ref 3.5–5.1)
SODIUM: 138 meq/L (ref 135–145)
TOTAL PROTEIN: 7.6 g/dL (ref 6.0–8.3)

## 2015-07-24 LAB — HEMOGLOBIN A1C: HEMOGLOBIN A1C: 5.8 % (ref 4.6–6.5)

## 2015-07-24 MED ORDER — MEDROXYPROGESTERONE ACETATE 10 MG PO TABS: ORAL_TABLET | ORAL | Status: DC

## 2015-07-24 MED ORDER — MELOXICAM 7.5 MG PO TABS: 7.5000 mg | ORAL_TABLET | Freq: Every day | ORAL | Status: DC

## 2015-07-24 NOTE — Progress Notes (Signed)
[facility] [address], [address] [place], [place] [address] [phone] [phone]  Date:  07/24/2015   Name:  Sophia Moody   DOB:  November 29, 1975   MRN:  [id number]  PCP:  Abbe AmsterdamOPLAND,Jabar Krysiak, MD    Chief Complaint: Establish Care   History of Present Illness:  Sophia Moody is a 40 y.o. very pleasant female patient who presents with the following:  Here today to establish care. She has been seen by this office in 2014- a little less than 3 years ago.    BP Readings from Last 3 Encounters:  09/12/12 90/70  01/18/12 110/70   Her GYN has been Dr. Lily PeerFernandez- per his last note in 2014 they were using 10 days of provera each month to induce menstruation.  She has primary infertility and oligomenorrhea She takes the provera maybe every 2-3 months.  She states that she does not see OBG any longer.  She still does not get menses unless she takes a course of provera.  She is not sure of why she does not get menses and is unclear on the cause of her infertility She has never had a child- it is unclear to me if she and her husband still want to try for this or if they have decided to be child free.   She states that she has "problems with my back pain a lot."  She went to PT and got some exercises that do help her.  As long as she does her exercises she does ok.  She has had back pain for 5 years or so.  Reports that she has had plain films and MRI x2 although I cannot see these records She does use mobic as needed for pain and would like a RF of same  She is fasting today for labs.    She thought this was to be her CPE- explained that we will have to schedule this at a later date She was seen last year at [facility] for her CPE according to her.   She did have a mammogram last year; it was normal   Patient Active Problem List   Diagnosis Date Noted  . Routine general medical examination at a health care facility 09/12/2012  . Family history of liver cancer  09/12/2012  . Infertility associated with anovulation 02/02/2012  . Oligomenorrhea 01/24/2012    Past Medical History  Diagnosis Date  . High cholesterol   . Chicken pox   . Back pain   . Allergy   . Arthritis     No past surgical history on file.  Social History  Substance Use Topics  . Smoking status: Never Smoker   . Smokeless tobacco: Never Used  . Alcohol Use: No    Family History  Problem Relation Age of Onset  . Cancer Father     liver  . Hypertension Father     No Known Allergies  Medication list has been reviewed and updated.  Current Outpatient Prescriptions on File Prior to Visit  Medication Sig Dispense Refill  . medroxyPROGESTERone (PROVERA) 10 MG tablet TAKE ONE TABLET 5-10 DAYS IF NO MENSES SPONTANEOUS MENSES EVERY 30 DAYS 30 tablet 1  . Naphazoline-Pheniramine (OPCON-A) 0.027-0.315 % SOLN Apply 1-2 drops to eye as needed. Reported on 07/23/2015    . Polyethyl Glycol-Propyl Glycol (SYSTANE ULTRA) 0.4-0.3 % SOLN Apply 1-2 drops to eye as needed. Reported on 07/23/2015     No current facility-administered medications on  file prior to visit.    Review of Systems:  As per HPI- otherwise negative.   Physical Examination: Filed Vitals:   07/24/15 0829  Pulse: 87  Temp: 97.6 F (36.4 C)   Filed Vitals:   07/24/15 0829  Height:  (1.575 m)  Weight: 104 lb 12.8 oz (47.537 kg)   Body mass index is 19.16 kg/(m^2). Ideal Body Weight: Weight in (lb) to have BMI = 25: 136.4  GEN: WDWN, NAD, Non-toxic, A & O x 3, very slight build HEENT: Atraumatic, Normocephalic. Neck supple. No masses, No LAD. Ears and Nose: No external deformity. CV: RRR, No M/G/R. No JVD. No thrill. No extra heart sounds. PULM: CTA B, no wheezes, crackles, rhonchi. No retractions. No resp. distress. No accessory muscle use. ABD: S, NT, ND, +BS. No rebound. No HSM. EXTR: No c/c/e NEURO Normal gait.  PSYCH: Normally interactive. Conversant. Not depressed or anxious appearing.   Calm demeanor.  Normal lumbar flexion. No apparent thoracolumbar scoliosis   Assessment and Plan: Establishing care with new doctor, encounter for  Primary anovulatory infertility - Plan: POCT urine pregnancy, medroxyPROGESTERone (PROVERA) 10 MG tablet  Amenorrhea - Plan: POCT urine pregnancy, medroxyPROGESTERone (PROVERA) 10 MG tablet  Screening for hyperlipidemia - Plan: Lipid panel  Screening for diabetes mellitus - Plan: Comprehensive metabolic panel, Hemoglobin A1c  Screening for deficiency anemia - Plan: CBC  Midline low back pain without sciatica - Plan: meloxicam (MOBIC) 7.5 MG tablet  Labs today as above and will follow-up with her There is some language and cultural barrier so I am not clear on if the pt and her husband still desire to have a child or not.  Offered the advice that having a child is likely possible but they will need to act soon. I am glad to refer to fertility if they like.  Also reassured that choosing to not have children is ok Will check HCG today- assuming negative she will continue to use provera every 30- 60 days as needed to induce a bleed Refilled her mobic to use as needed for back pain.  I am not privy to her previous eval but it she reports that simply doing some PT exercises and stretches keeps her back pain at bay.  Encouraged her to continue same   Signed Abbe Amsterdam, MD

## 2015-07-24 NOTE — Progress Notes (Signed)
Pre visit review using our clinic review tool, if applicable. No additional management support is needed unless otherwise documented below in the visit note. 

## 2015-07-24 NOTE — Patient Instructions (Signed)
It was nice to meet you today!   I will be in touch with your labs You can continue to use the provera as needed to bring on a period- if you do not have bleeding every 30 days or so, please take a home pregnancy test.  If negative then take the provera for 5- 10 days.  You should then have a bleed  If you DO wish to have a baby it is likely possible, but you will want to discuss this with a fertility specialist soon!  Let me know if you need a referral.  It is also ok to choose to not have children.    Please come and see me in about 6 months for a physical exam

## 2015-07-25 LAB — POCT URINE PREGNANCY: Preg Test, Ur: NEGATIVE

## 2017-03-08 NOTE — Progress Notes (Signed)
[facility] [address], [address] [place], [place] [address] [phone] [phone]  Date:  03/10/2017   Name:  Sophia Moody   DOB:  1975/11/01   MRN:  [id number]  PCP:  Sophia Moody    Chief Complaint: Annual Exam (Pt here for CPE with PAP. Flu vaccine. )   History of Present Illness:  Sophia Moody is a 41 y.o. very pleasant female patient who presents with the following: Here today for a CPE Last seen by myself in March of 2017: Here today to establish care. She has been seen by this office in 2014- a little less than 3 years ago.       BP Readings from Last 3 Encounters:  09/12/12 90/70  01/18/12 110/70   Her GYN has been Dr. Lily PeerFernandez- per his last note in 2014 they were using 10 days of provera each month to induce menstruation.  She has primary infertility and oligomenorrhea She takes the provera maybe every 2-3 months.  She states that she does not see OBG any longer.  She still does not get menses unless she takes a course of provera.  She is not sure of why she does not get menses and is unclear on the cause of her infertility She has never had a child- it is unclear to me if she and her husband still want to try for this or if they have decided to be child free.   She states that she has "problems with my back pain a lot."  She went to PT and got some exercises that do help her.  As long as she does her exercises she does ok.  She has had back pain for 5 years or so.  Reports that she has had plain films and MRI x2 although I cannot see these records She does use mobic as needed for pain and would like a RF of same  She is fasting today for labs.    She thought this was to be her CPE- explained that we will have to schedule this at a later date She was seen last year at [facility] for her CPE according to her.   She did have a mammogram last year; it was normal   Here today for a CPE  Tetanus: unsure of last, would  like to do today Flu: today Mammo: she is due, I will schedule this for her Pap: would like to do today, never had an abnormal per her knowledge Labs: she is due, she is fasting today  She is using mobic for her chronic back pain as above- it does help, she needs a RF today Also needs a refill of her provera which she takes every few months to stimulate a bleed.  Otherwise she doesnot have menses  Patient Active Problem List   Diagnosis Date Noted  . Routine general medical examination at a health care facility 09/12/2012  . Family history of liver cancer 09/12/2012  . Infertility associated with anovulation 02/02/2012  . Oligomenorrhea 01/24/2012    Past Medical History:  Diagnosis Date  . Allergy   . Arthritis   . Back pain   . Chicken pox   . High cholesterol     No past surgical history on file.  Social History  Substance Use Topics  . Smoking status: Never Smoker  . Smokeless tobacco: Never Used  . Alcohol use No    Family History  Problem  Relation Age of Onset  . Cancer Father        liver  . Hypertension Father     No Known Allergies  Medication list has been reviewed and updated.  Current Outpatient Prescriptions on File Prior to Visit  Medication Sig Dispense Refill  . Magnesium 250 MG TABS Take 250 mg by mouth as needed.    . Naphazoline-Pheniramine (OPCON-A) 0.027-0.315 % SOLN Apply 1-2 drops to eye as needed. Reported on 07/23/2015    . Polyethyl Glycol-Propyl Glycol (SYSTANE ULTRA) 0.4-0.3 % SOLN Apply 1-2 drops to eye as needed. Reported on 07/23/2015     No current facility-administered medications on file prior to visit.     Review of Systems:  As per HPI- otherwise negative. No fever or chills No chest pain or SOB No cough or ST   Physical Examination: Vitals:   03/10/17 1120  BP: 100/64  Pulse: 89  Temp: 97.9 F (36.6 C)  SpO2: 99%   Vitals:   03/10/17 1120  Weight: 105 lb 12.8 oz (48 kg)  Height: 5' (1.524 m)   Body mass index  is 20.66 kg/m. Ideal Body Weight: Weight in (lb) to have BMI = 25: 127.7  GEN: WDWN, NAD, Non-toxic, A & O x 3, slim build, looks well HEENT: Atraumatic, Normocephalic. Neck supple. No masses, No LAD.  Bilateral TM wnl, oropharynx normal.  PEERL,EOMI.   Ears and Nose: No external deformity. CV: RRR, No M/G/R. No JVD. No thrill. No extra heart sounds. PULM: CTA B, no wheezes, crackles, rhonchi. No retractions. No resp. distress. No accessory muscle use. ABD: S, NT, ND,. No rebound. No HSM. EXTR: No c/c/e NEURO Normal gait.  PSYCH: Normally interactive. Conversant. Not depressed or anxious appearing.  Calm demeanor.  Breast: normal exam, no masses/ dimpling/ discharge Pelvic: normal, no vaginal lesions or discharge. Uterus normal, no CMT, no adnexal tendereness or masses     Assessment and Plan: Physical exam  Screening for hyperlipidemia - Plan: Lipid panel  Screening for diabetes mellitus - Plan: Comprehensive metabolic panel, Hemoglobin A1c  Screening for deficiency anemia - Plan: CBC  Primary anovulatory infertility - Plan: medroxyPROGESTERone (PROVERA) 10 MG tablet  Amenorrhea - Plan: medroxyPROGESTERone (PROVERA) 10 MG tablet  Immunization due - Plan: Tdap vaccine greater than or equal to 7yo IM  Screening for cervical cancer - Plan: Cytology - PAP  Screening for breast cancer - Plan: MM SCREENING BREAST TOMO BILATERAL  Chronic midline low back pain without sciatica - Plan: meloxicam (MOBIC) 7.5 MG tablet  CPE today Refilled her mobic and provera Pap and labs pending as above Encouraged continued healthy diet and weight maintained Continue to abstain from smoking Pap today Ordered mammo for her  Signed Abbe Amsterdam, Moody

## 2017-03-10 ENCOUNTER — Ambulatory Visit (INDEPENDENT_AMBULATORY_CARE_PROVIDER_SITE_OTHER): Payer: BLUE CROSS/BLUE SHIELD | Admitting: Family Medicine

## 2017-03-10 ENCOUNTER — Other Ambulatory Visit (HOSPITAL_COMMUNITY)
Admission: RE | Admit: 2017-03-10 | Discharge: 2017-03-10 | Disposition: A | Discharge status: Home / Self Care | Payer: BLUE CROSS/BLUE SHIELD | Source: Ambulatory Visit | Attending: Family Medicine | Admitting: Family Medicine

## 2017-03-10 VITALS — BP 100/64 | HR 89 | Temp 97.9°F | Ht 60.0 in | Wt 105.8 lb

## 2017-03-10 DIAGNOSIS — M545 Low back pain: Secondary | ICD-10-CM | POA: Diagnosis not present

## 2017-03-10 DIAGNOSIS — Z131 Encounter for screening for diabetes mellitus: Secondary | ICD-10-CM | POA: Diagnosis not present

## 2017-03-10 DIAGNOSIS — G8929 Other chronic pain: Secondary | ICD-10-CM | POA: Diagnosis not present

## 2017-03-10 DIAGNOSIS — Z1231 Encounter for screening mammogram for malignant neoplasm of breast: Secondary | ICD-10-CM

## 2017-03-10 DIAGNOSIS — N912 Amenorrhea, unspecified: Secondary | ICD-10-CM | POA: Diagnosis not present

## 2017-03-10 DIAGNOSIS — N970 Female infertility associated with anovulation: Secondary | ICD-10-CM

## 2017-03-10 DIAGNOSIS — Z23 Encounter for immunization: Secondary | ICD-10-CM | POA: Diagnosis not present

## 2017-03-10 DIAGNOSIS — Z0000 Encounter for general adult medical examination without abnormal findings: Secondary | ICD-10-CM

## 2017-03-10 DIAGNOSIS — Z124 Encounter for screening for malignant neoplasm of cervix: Secondary | ICD-10-CM | POA: Diagnosis present

## 2017-03-10 DIAGNOSIS — Z13220 Encounter for screening for lipoid disorders: Secondary | ICD-10-CM | POA: Diagnosis not present

## 2017-03-10 DIAGNOSIS — Z1239 Encounter for other screening for malignant neoplasm of breast: Secondary | ICD-10-CM

## 2017-03-10 DIAGNOSIS — Z130 Encounter for screening for diseases of the blood and blood-forming organs and certain disorders involving the immune mechanism: Secondary | ICD-10-CM | POA: Insufficient documentation

## 2017-03-10 LAB — COMPREHENSIVE METABOLIC PANEL
ALBUMIN: 4.6 g/dL (ref 3.5–5.2)
ALK PHOS: 54 U/L (ref 39–117)
ALT: 20 U/L (ref 0–35)
AST: 18 U/L (ref 0–37)
BILIRUBIN TOTAL: 0.4 mg/dL (ref 0.2–1.2)
BUN: 12 mg/dL (ref 6–23)
CO2: 31 mEq/L (ref 19–32)
Calcium: 9.6 mg/dL (ref 8.4–10.5)
Chloride: 101 mEq/L (ref 96–112)
Creatinine, Ser: 0.69 mg/dL (ref 0.40–1.20)
GFR: 98.97 mL/min (ref >60.00)
Glucose, Bld: 102 mg/dL — ABNORMAL HIGH (ref 70–99)
Potassium: 5.1 mEq/L (ref 3.5–5.1)
Sodium: 138 mEq/L (ref 135–145)
TOTAL PROTEIN: 7.8 g/dL (ref 6.0–8.3)

## 2017-03-10 LAB — LIPID PANEL
CHOL/HDL RATIO: 5
Cholesterol: 243 mg/dL — ABNORMAL HIGH (ref 0–200)
HDL: 50.9 mg/dL (ref >39.00)
LDL Cholesterol: 155 mg/dL — ABNORMAL HIGH (ref 0–99)
NonHDL: 191.76
Triglycerides: 184 mg/dL — ABNORMAL HIGH (ref 0.0–149.0)
VLDL: 36.8 mg/dL (ref 0.0–40.0)

## 2017-03-10 LAB — CBC
HCT: 44.4 % (ref 36.0–46.0)
HEMOGLOBIN: 14.6 g/dL (ref 12.0–15.0)
MCHC: 32.8 g/dL (ref 30.0–36.0)
MCV: 94 fl (ref 78.0–100.0)
Platelets: 289 10*3/uL (ref 150.0–400.0)
RBC: 4.72 Mil/uL (ref 3.87–5.11)
RDW: 12.7 % (ref 11.5–15.5)
WBC: 7.4 10*3/uL (ref 4.0–10.5)

## 2017-03-10 LAB — HEMOGLOBIN A1C: HEMOGLOBIN A1C: 5.8 % (ref 4.6–6.5)

## 2017-03-10 MED ORDER — MEDROXYPROGESTERONE ACETATE 10 MG PO TABS: ORAL_TABLET | ORAL | 3 refills | Status: AC

## 2017-03-10 MED ORDER — MELOXICAM 7.5 MG PO TABS: 7.5000 mg | ORAL_TABLET | Freq: Every day | ORAL | 5 refills | Status: AC

## 2017-03-10 NOTE — Patient Instructions (Addendum)
It was nice to see you today - take care and I will be in touch with your labs We will set you up for a mammogram  You got your tdap and flu shots today I refilled your provera and your mobic for back pain

## 2017-03-15 LAB — CYTOLOGY - PAP
Diagnosis: NEGATIVE
HPV (WINDOPATH): NOT DETECTED

## 2017-03-16 ENCOUNTER — Encounter (HOSPITAL_BASED_OUTPATIENT_CLINIC_OR_DEPARTMENT_OTHER): Payer: Self-pay

## 2017-03-16 ENCOUNTER — Ambulatory Visit (HOSPITAL_BASED_OUTPATIENT_CLINIC_OR_DEPARTMENT_OTHER)
Admission: RE | Admit: 2017-03-16 | Discharge: 2017-03-16 | Disposition: A | Discharge status: Home / Self Care | Payer: BLUE CROSS/BLUE SHIELD | Source: Ambulatory Visit | Attending: Family Medicine | Admitting: Family Medicine

## 2017-03-16 DIAGNOSIS — Z1231 Encounter for screening mammogram for malignant neoplasm of breast: Secondary | ICD-10-CM | POA: Diagnosis present

## 2017-03-16 DIAGNOSIS — Z1239 Encounter for other screening for malignant neoplasm of breast: Secondary | ICD-10-CM

## 2017-03-18 ENCOUNTER — Encounter: Payer: Self-pay | Admitting: Family Medicine

## 2017-07-14 ENCOUNTER — Encounter: Payer: Self-pay | Admitting: Medical

## 2017-07-14 ENCOUNTER — Ambulatory Visit: Payer: BLUE CROSS/BLUE SHIELD | Admitting: Medical

## 2017-07-14 VITALS — BP 94/66 | HR 97 | Temp 98.3°F | Resp 16 | Ht 60.0 in | Wt 107.0 lb

## 2017-07-14 DIAGNOSIS — J40 Bronchitis, not specified as acute or chronic: Secondary | ICD-10-CM | POA: Diagnosis not present

## 2017-07-14 DIAGNOSIS — R05 Cough: Secondary | ICD-10-CM

## 2017-07-14 DIAGNOSIS — J0100 Acute maxillary sinusitis, unspecified: Secondary | ICD-10-CM | POA: Diagnosis not present

## 2017-07-14 DIAGNOSIS — R059 Cough, unspecified: Secondary | ICD-10-CM

## 2017-07-14 MED ORDER — FLUTICASONE PROPIONATE 50 MCG/ACT NA SUSP: 2.0000 | Freq: Every day | NASAL | 1 refills | Status: DC

## 2017-07-14 MED ORDER — CEFTRIAXONE SODIUM 500 MG IJ SOLR
500.0000 mg | Freq: Once | INTRAMUSCULAR | Status: AC
  Administered 2017-07-14: 500 mg via INTRAMUSCULAR

## 2017-07-14 MED ORDER — AZITHROMYCIN 250 MG PO TABS: ORAL_TABLET | ORAL | 0 refills | Status: AC

## 2017-07-14 MED ORDER — HYDROCODONE-HOMATROPINE 5-1.5 MG/5ML PO SYRP: 5.0000 mL | ORAL_SOLUTION | Freq: Three times a day (TID) | ORAL | 0 refills | Status: AC | PRN

## 2017-07-14 NOTE — Progress Notes (Signed)
Subjective:    Patient ID: Sophia Moody, female    DOB: [birth year], 42 y.o.   MRN: [id number]  HPI  Pt in states about over past 10 days has been sick. She states nasal congestion and head feels heavy. She had been coughing up some mucus in mornings(during the day cough dry). Describes yellowish mucus when coughs. Some colored mucus when blows nose.  No wheezing.  Some sinus pressure.  Early on had some fever and sweats but not recently.  Pt had some body aches early on moderate to severe.  That was 10 days ago but very minimal achiness now.    Review of Systems  Constitutional: Positive for diaphoresis and fever. Negative for chills.  HENT: Positive for congestion, sinus pressure and sinus pain. Negative for ear pain and trouble swallowing.   Respiratory: Positive for cough. Negative for chest tightness, shortness of breath and wheezing.   Cardiovascular: Negative for chest pain and palpitations.  Gastrointestinal: Negative for abdominal pain, blood in stool and vomiting.  Musculoskeletal: Negative for back pain and neck pain.  Skin: Negative for rash.  Neurological: Negative for dizziness and headaches.  Hematological: Negative for adenopathy. Does not bruise/bleed easily.    Past Medical History:  Diagnosis Date  . Allergy   . Arthritis   . Back pain   . Chicken pox   . High cholesterol      Social History   Socioeconomic History  . Marital status: Married    Spouse name: Not on file  . Number of children: Not on file  . Years of education: Not on file  . Highest education level: Not on file  Social Needs  . Financial resource strain: Not on file  . Food insecurity - worry: Not on file  . Food insecurity - inability: Not on file  . Transportation needs - medical: Not on file  . Transportation needs - non-medical: Not on file  Occupational History  . Not on file  Tobacco Use  . Smoking status: Never Smoker  . Smokeless tobacco: Never Used  Substance and  Sexual Activity  . Alcohol use: No  . Drug use: No  . Sexual activity: Yes    Birth control/protection: None  Other Topics Concern  . Not on file  Social History Narrative  . Not on file    No past surgical history on file.  Family History  Problem Relation Age of Onset  . Cancer Father        liver  . Hypertension Father     No Known Allergies  Current Outpatient Medications on File Prior to Visit  Medication Sig Dispense Refill  . Magnesium 250 MG TABS Take 250 mg by mouth as needed.    . medroxyPROGESTERone (PROVERA) 10 MG tablet TAKE ONE TABLET 5-10 DAYS IF NO MENSES SPONTANEOUS MENSES EVERY 30 DAYS 30 tablet 3  . meloxicam (MOBIC) 7.5 MG tablet Take 1 tablet (7.5 mg total) by mouth daily. Use if needed for back pain 30 tablet 5  . Naphazoline-Pheniramine (OPCON-A) 0.027-0.315 % SOLN Apply 1-2 drops to eye as needed. Reported on 07/23/2015    . Polyethyl Glycol-Propyl Glycol (SYSTANE ULTRA) 0.4-0.3 % SOLN Apply 1-2 drops to eye as needed. Reported on 07/23/2015     No current facility-administered medications on file prior to visit.     BP 94/66 (BP Location: Left Arm, Patient Position: Sitting, Cuff Size: Small)   Pulse 97   Temp 98.3 F (36.8 C) (Oral)   Resp  16   Ht 5' (1.524 m)   Wt 107 lb (48.5 kg)   SpO2 99%   BMI 20.90 kg/m       Objective:   Physical Exam  General  Mental Status - Alert. General Appearance - Well groomed. Not in acute distress.  Skin Rashes- No Rashes.  HEENT Head- Normal. Ear Auditory Canal - Left- Normal. Right - Normal.Tympanic Membrane- Left- Normal. Right- Normal. Eye Sclera/Conjunctiva- Left- Normal. Right- Normal. Nose & Sinuses Nasal Mucosa- Left-  Boggy and Congested. Right-  Boggy and  Congested.Faint maxillary pressure and frontal sinus pressure. Mouth & Throat Lips: Upper Lip- Normal: no dryness, cracking, pallor, cyanosis, or vesicular eruption. Lower Lip-Normal: no dryness, cracking, pallor, cyanosis or vesicular  eruption. Buccal Mucosa- Bilateral- No Aphthous ulcers. Oropharynx- No Discharge or Erythema. Tonsils: Characteristics- Bilateral- No Erythema or Congestion. Size/Enlargement- Bilateral- No enlargement. Discharge- bilateral-None.  Neck Neck- Supple. No Masses.   Chest and Lung Exam Auscultation: Breath Sounds:-Clear even and unlabored.  Cardiovascular Auscultation:Rythm- Regular, rate and rhythm. Murmurs & Other Heart Sounds:Ausculatation of the heart reveal- No Murmurs.  Lymphatic Head & Neck General Head & Neck Lymphatics: Bilateral: Description- No Localized lymphadenopathy.       Assessment & Plan:   You appear to have bronchitis and sinusitis. Rest hydrate and tylenol for fever. I am prescribing cough medicine Hycodan, and azithromycin antibiotic. For your nasal congestion I prescribed Flonase.  At patient request for injection antibiotic and after discussion, I decided we will give patient Rocephin 500 mg IM.  She also asked for steroid injection but I thought that was unnecessary at this point.  You should gradually get better. If not then notify us and would recommend a chest xray.  Follow up in 7-10 days or as needed

## 2017-07-14 NOTE — Patient Instructions (Signed)
You appear to have bronchitis and sinusitis. Rest hydrate and tylenol for fever. I am prescribing cough medicine Hycodan, and azithromycin antibiotic. For your nasal congestion I prescribed Flonase.  At patient request for injection antibiotic and after discussion, I decided we will give patient Rocephin 500 mg IM.    You should gradually get better. If not then notify us and would recommend a chest xray.  Follow up in 7-10 days or as needed

## 2018-02-08 ENCOUNTER — Other Ambulatory Visit: Payer: Self-pay | Admitting: Family Medicine

## 2018-02-08 DIAGNOSIS — Z1231 Encounter for screening mammogram for malignant neoplasm of breast: Secondary | ICD-10-CM

## 2018-03-21 ENCOUNTER — Ambulatory Visit (HOSPITAL_BASED_OUTPATIENT_CLINIC_OR_DEPARTMENT_OTHER)
Admission: RE | Admit: 2018-03-21 | Discharge: 2018-03-21 | Disposition: A | Discharge status: Home / Self Care | Payer: BLUE CROSS/BLUE SHIELD | Source: Ambulatory Visit | Attending: Family Medicine | Admitting: Family Medicine

## 2018-03-21 DIAGNOSIS — Z1231 Encounter for screening mammogram for malignant neoplasm of breast: Secondary | ICD-10-CM | POA: Insufficient documentation

## 2019-02-16 ENCOUNTER — Other Ambulatory Visit (HOSPITAL_BASED_OUTPATIENT_CLINIC_OR_DEPARTMENT_OTHER): Payer: Self-pay | Admitting: Family Medicine

## 2019-02-16 DIAGNOSIS — Z1231 Encounter for screening mammogram for malignant neoplasm of breast: Secondary | ICD-10-CM

## 2019-03-27 ENCOUNTER — Ambulatory Visit (HOSPITAL_BASED_OUTPATIENT_CLINIC_OR_DEPARTMENT_OTHER): Payer: BLUE CROSS/BLUE SHIELD

## 2019-05-12 IMAGING — MG DIGITAL SCREENING BILATERAL MAMMOGRAM WITH TOMO AND CAD
8 series · 8 of 24 positions shown · non-contrast
Comparison: Previous exam(s).

CLINICAL DATA: Screening.

EXAM:
DIGITAL SCREENING BILATERAL MAMMOGRAM WITH TOMO AND CAD

[L MLO synth-2D]
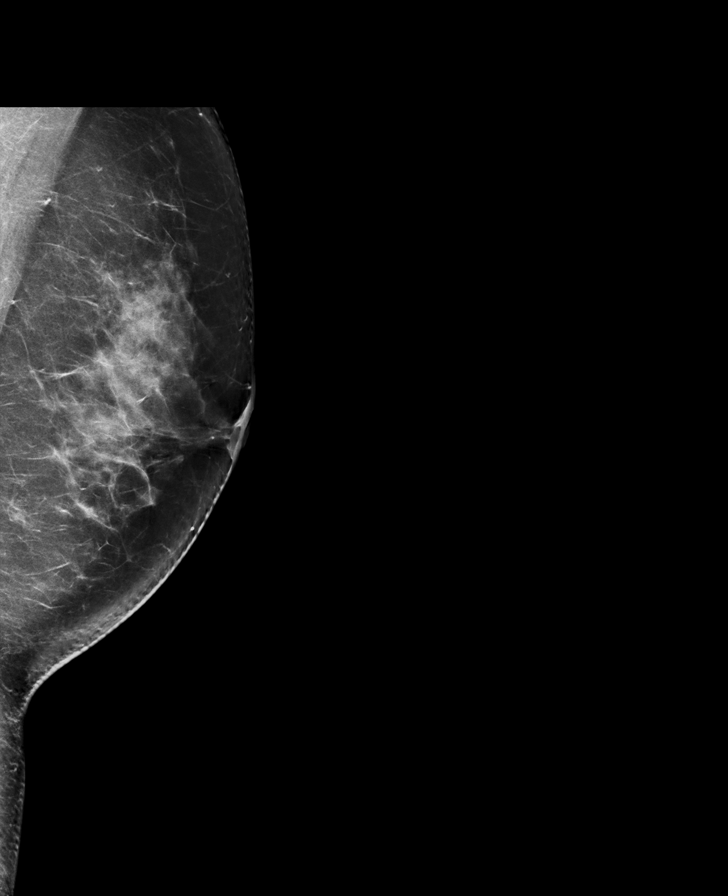

[R MLO synth-2D]
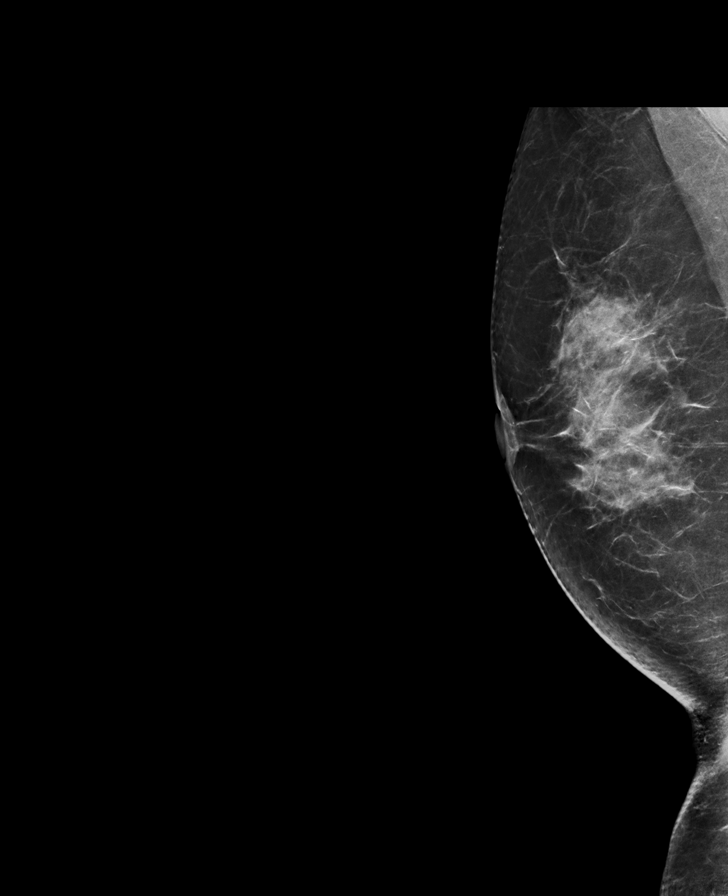

[L CC synth-2D]
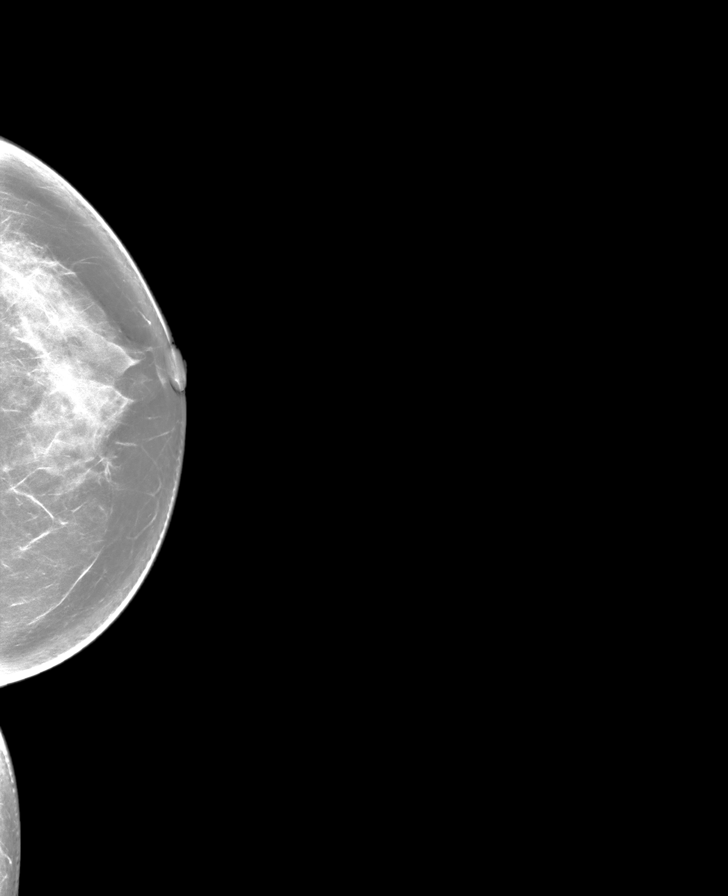

[R CC synth-2D]
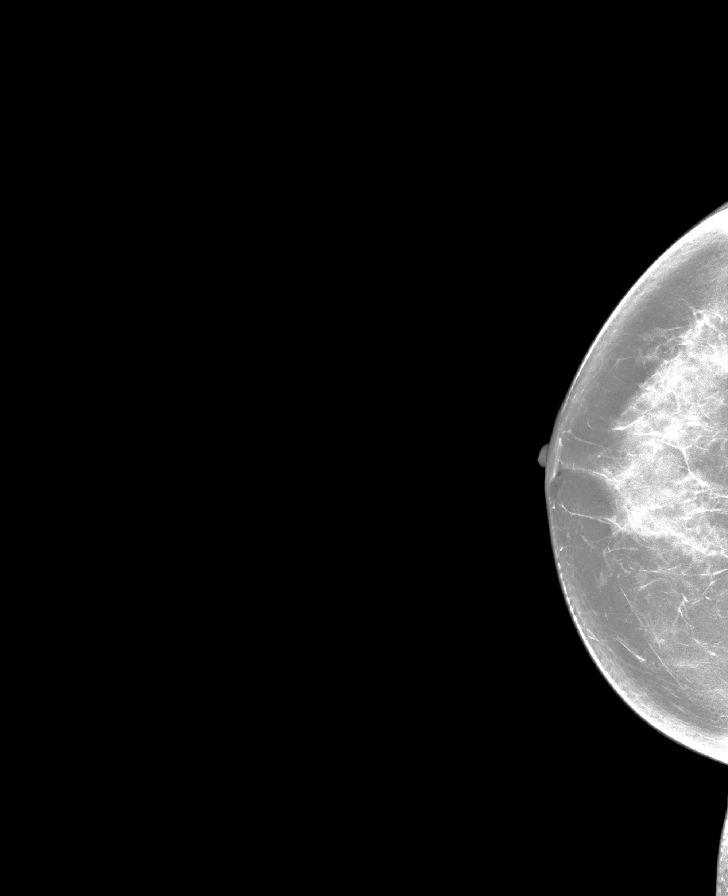

[R CC tomo · tomo slice 43/86.0]
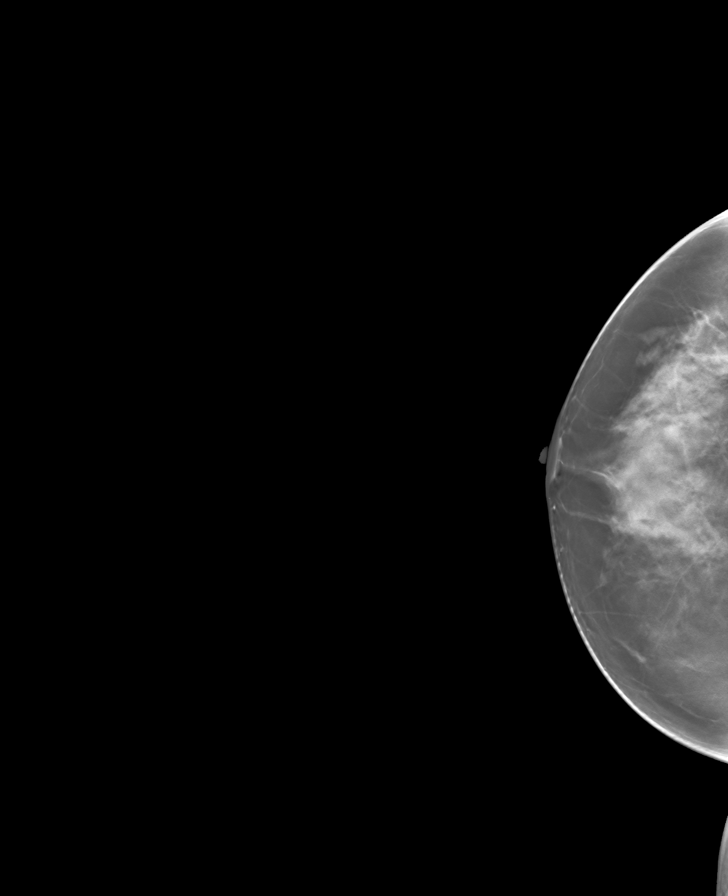

[R MLO tomo · tomo slice 41/81.0]
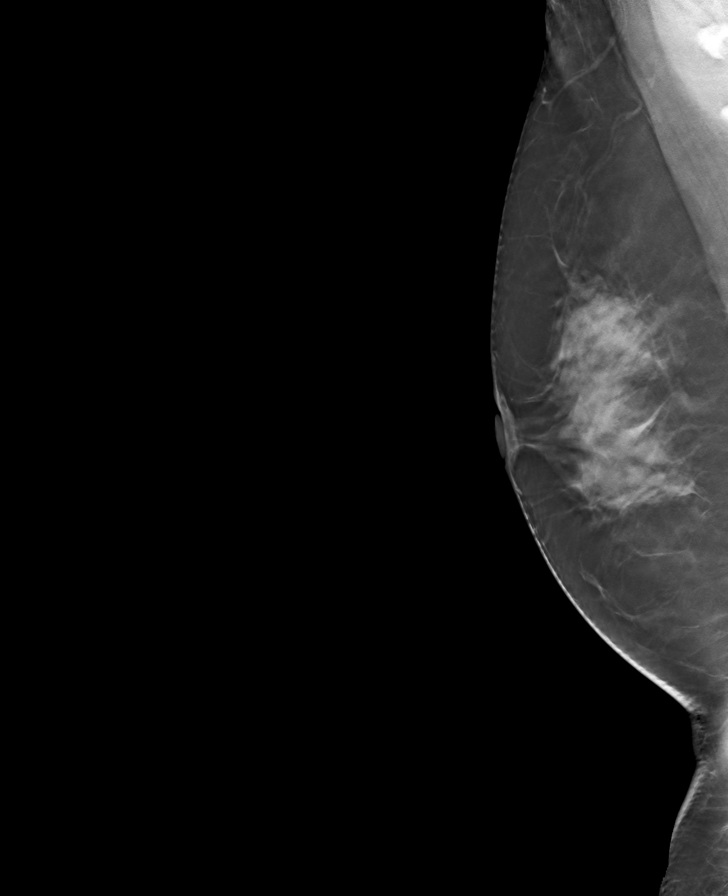

[L CC tomo · tomo slice 45/88.0]
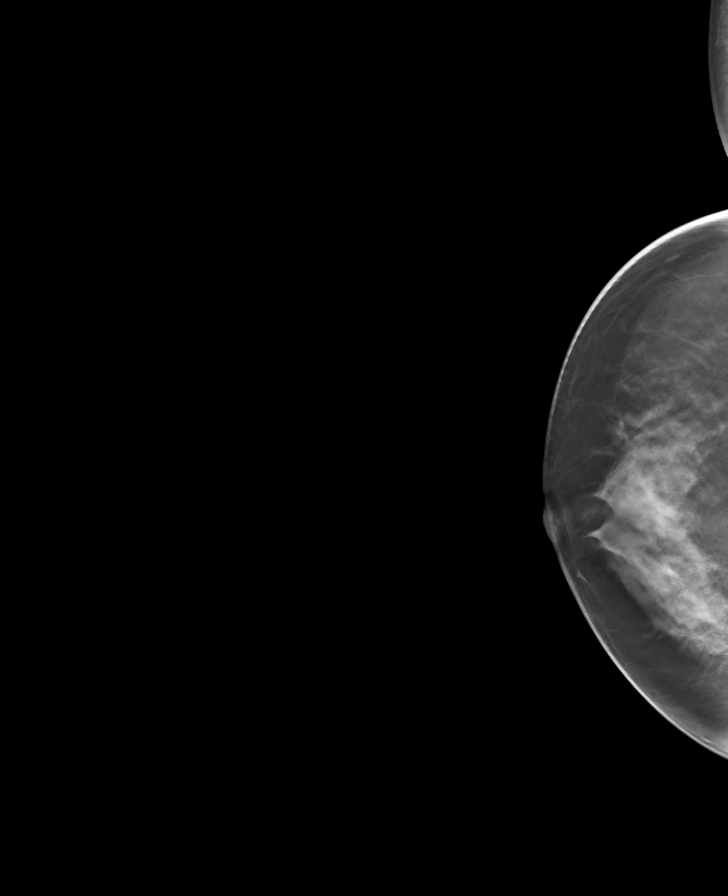

[L MLO tomo · tomo slice 45/90.0]
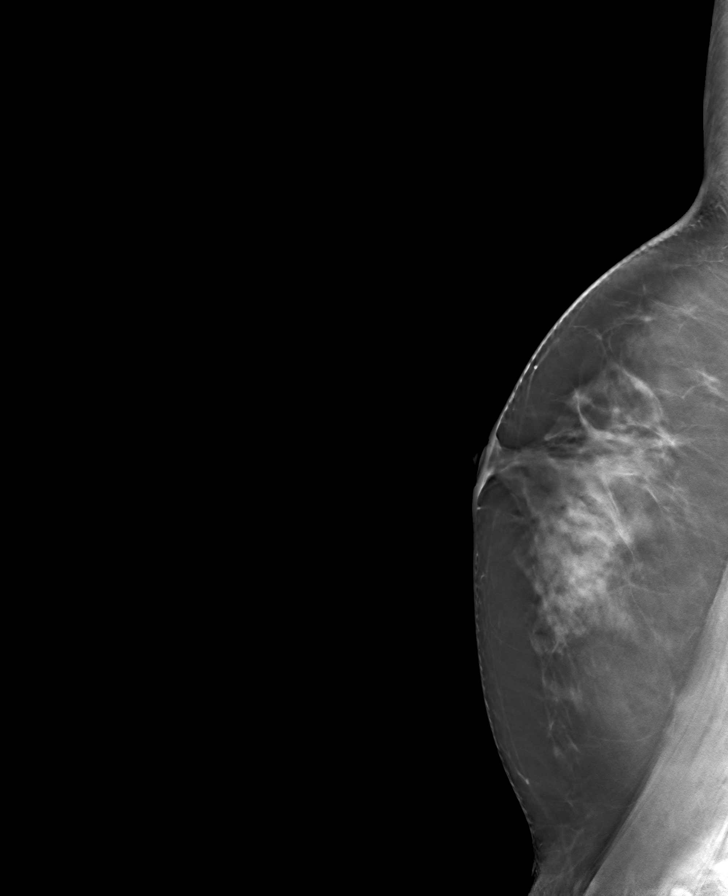

[8 of 24 positions shown; findings below may reference images not displayed]

ACR Breast Density Category d: The breast tissue is extremely dense,
which lowers the sensitivity of mammography.
FINDINGS: There are no findings suspicious for malignancy. Images were
processed with CAD.
IMPRESSION: No mammographic evidence of malignancy. A result letter of this
screening mammogram will be mailed directly to the patient.

RECOMMENDATION:
Screening mammogram in one year. (Code:RA-I-AVB)

BI-RADS CATEGORY  1: Negative.

## 2022-09-29 ENCOUNTER — Ambulatory Visit
Admission: EM | Admit: 2022-09-29 | Discharge: 2022-09-29 | Disposition: A | Discharge status: Home / Self Care | Payer: BLUE CROSS/BLUE SHIELD

## 2022-09-29 DIAGNOSIS — H6991 Unspecified Eustachian tube disorder, right ear: Secondary | ICD-10-CM

## 2022-09-29 MED ORDER — PSEUDOEPHEDRINE HCL 30 MG PO TABS: 30.0000 mg | ORAL_TABLET | Freq: Three times a day (TID) | ORAL | 0 refills | Status: AC | PRN

## 2022-09-29 MED ORDER — FLUTICASONE PROPIONATE 50 MCG/ACT NA SUSP: 2.0000 | Freq: Every day | NASAL | 1 refills | Status: AC

## 2022-09-29 MED ORDER — CETIRIZINE HCL 10 MG PO TABS: 10.0000 mg | ORAL_TABLET | Freq: Every day | ORAL | 0 refills | Status: AC

## 2022-09-29 NOTE — ED Triage Notes (Signed)
Pt reports right ear pain. Reports she felt out of balance 4 days ago was not abel to drive, 409 said she has an ear infection. Tylenol gives some relief.

## 2022-09-29 NOTE — ED Provider Notes (Signed)
[facility]  Note:  This document was prepared using Conservation officer, historic buildings and may include unintentional dictation errors.  MRN: [id number] DOB: [birth year]  Subjective:   Sophia Moody is a 47 y.o. female presenting for 4-day history of persistent right ear pain, tinnitus and vertigo.  She was severely affected and ended up having to call 911 because she was not able to drive.  They came and evaluated her, deemed that she was stable.  Advised that she be evaluated for an ear infection at an urgent care.  Has used some Tylenol with minimal relief.  No confusion, headache, vision changes, weakness, numbness or tingling, chest pain, shortness of breath, heart racing, palpitations, nausea, vomiting, abdominal pain.  No current facility-administered medications for this encounter.  Current Outpatient Medications:    acetaminophen (TYLENOL) 500 MG tablet, Take 500 mg by mouth every 6 (six) hours as needed., Disp: , Rfl:    azithromycin (ZITHROMAX) 250 MG tablet, Take 2 tablets by mouth on day 1, followed by 1 tablet by mouth daily for 4 days., Disp: 6 tablet, Rfl: 0   fluticasone (FLONASE) 50 MCG/ACT nasal spray, Place 2 sprays into both nostrils daily., Disp: 16 g, Rfl: 1   HYDROcodone-homatropine (HYCODAN) 5-1.5 MG/5ML syrup, Take 5 mLs by mouth every 8 (eight) hours as needed for cough., Disp: 100 mL, Rfl: 0   Magnesium 250 MG TABS, Take 250 mg by mouth as needed., Disp: , Rfl:    medroxyPROGESTERone (PROVERA) 10 MG tablet, TAKE ONE TABLET 5-10 DAYS IF NO MENSES SPONTANEOUS MENSES EVERY 30 DAYS, Disp: 30 tablet, Rfl: 3   meloxicam (MOBIC) 7.5 MG tablet, Take 1 tablet (7.5 mg total) by mouth daily. Use if needed for back pain, Disp: 30 tablet, Rfl: 5   Naphazoline-Pheniramine (OPCON-A) 0.027-0.315 % SOLN, Apply 1-2 drops to eye as needed. Reported on 07/23/2015, Disp: , Rfl:    Polyethyl Glycol-Propyl Glycol (SYSTANE ULTRA) 0.4-0.3 % SOLN, Apply 1-2 drops to eye as  needed. Reported on 07/23/2015, Disp: , Rfl:    No Known Allergies  Past Medical History:  Diagnosis Date   Allergy    Arthritis    Back pain    Chicken pox    High cholesterol      History reviewed. No pertinent surgical history.  Family History  Problem Relation Age of Onset   Cancer Father        liver   Hypertension Father     Social History   Tobacco Use   Smoking status: Never   Smokeless tobacco: Never  Vaping Use   Vaping Use: Never used  Substance Use Topics   Alcohol use: No   Drug use: No    ROS   Objective:   Vitals: BP 104/70 (BP Location: Left Arm)   Pulse 95   Temp 98.2 F (36.8 C) (Oral)   Resp 16   LMP  (Within Days)   SpO2 97%   Physical Exam Constitutional:      General: She is not in acute distress.    Appearance: Normal appearance. She is well-developed and normal weight. She is not ill-appearing, toxic-appearing or diaphoretic.  HENT:     Head: Normocephalic and atraumatic.     Right Ear: Tympanic membrane, ear canal and external ear normal. No drainage or tenderness. No middle ear effusion. There is no impacted cerumen. Tympanic membrane is not erythematous or bulging.     Left Ear: Tympanic membrane, ear canal and external ear  normal. No drainage or tenderness.  No middle ear effusion. There is no impacted cerumen. Tympanic membrane is not erythematous or bulging.     Nose: Nose normal. No congestion or rhinorrhea.     Mouth/Throat:     Mouth: Mucous membranes are moist. No oral lesions.     Pharynx: No pharyngeal swelling, oropharyngeal exudate, posterior oropharyngeal erythema or uvula swelling.     Tonsils: No tonsillar exudate or tonsillar abscesses.  Eyes:     General: Lids are normal. Lids are everted, no foreign bodies appreciated. Vision grossly intact. No scleral icterus.       Right eye: No foreign body, discharge or hordeolum.        Left eye: No foreign body, discharge or hordeolum.     Extraocular Movements:  Extraocular movements intact.     Right eye: Normal extraocular motion.     Left eye: Normal extraocular motion and no nystagmus.     Conjunctiva/sclera: Conjunctivae normal.     Right eye: Right conjunctiva is not injected. No chemosis, exudate or hemorrhage.    Left eye: Left conjunctiva is not injected. No chemosis, exudate or hemorrhage. Cardiovascular:     Rate and Rhythm: Normal rate.  Pulmonary:     Effort: Pulmonary effort is normal.  Musculoskeletal:     Cervical back: Normal range of motion and neck supple.  Lymphadenopathy:     Cervical: No cervical adenopathy.  Skin:    General: Skin is warm and dry.  Neurological:     General: No focal deficit present.     Mental Status: She is alert and oriented to person, place, and time.     Cranial Nerves: No cranial nerve deficit.     Motor: No weakness.     Coordination: Coordination normal.     Gait: Gait normal.     Deep Tendon Reflexes: Reflexes normal.  Psychiatric:        Mood and Affect: Mood normal.        Behavior: Behavior normal.        Thought Content: Thought content normal.        Judgment: Judgment normal.     Assessment and Plan :   PDMP not reviewed this encounter.  1. Eustachian tube dysfunction, right    No signs of an acute encephalopathy.  Unremarkable ENT exam.  Will use conservative management for what I suspect is eustachian tube dysfunction.  Recommended starting Flonase, Zyrtec, Sudafed. Counseled patient on potential for adverse effects with medications prescribed/recommended today, ER and return-to-clinic precautions discussed, patient verbalized understanding.    Wallis Bamberg, [place] 09/29/22 1610
